# Patient Record
Sex: Female | Born: 1955 | Race: White | Hispanic: No | Smoking: Never smoker
Health system: Southern US, Community
[De-identification: ages and names within clinical notes are randomized; demographics above are authoritative.]

## PROBLEM LIST (undated history)

## (undated) DIAGNOSIS — F32A Depression, unspecified: Secondary | ICD-10-CM

## (undated) DIAGNOSIS — F329 Major depressive disorder, single episode, unspecified: Secondary | ICD-10-CM

## (undated) DIAGNOSIS — J45909 Unspecified asthma, uncomplicated: Secondary | ICD-10-CM

## (undated) HISTORY — PX: TUBAL LIGATION: SHX77

## (undated) HISTORY — PX: TONSILLECTOMY: SUR1361

## (undated) HISTORY — PX: CHOLECYSTECTOMY: SHX55

---

## 2004-08-24 ENCOUNTER — Inpatient Hospital Stay: Payer: Self-pay | Admitting: Internal Medicine

## 2004-10-24 ENCOUNTER — Ambulatory Visit: Payer: Self-pay

## 2004-11-11 ENCOUNTER — Ambulatory Visit: Payer: Self-pay

## 2005-02-23 ENCOUNTER — Ambulatory Visit: Payer: Self-pay

## 2006-08-21 ENCOUNTER — Ambulatory Visit: Payer: Self-pay | Admitting: Internal Medicine

## 2010-11-05 ENCOUNTER — Emergency Department: Payer: Self-pay | Admitting: Emergency Medicine

## 2012-11-07 ENCOUNTER — Other Ambulatory Visit: Payer: Self-pay | Admitting: Endocrinology

## 2012-11-07 DIAGNOSIS — E041 Nontoxic single thyroid nodule: Secondary | ICD-10-CM

## 2012-11-20 ENCOUNTER — Other Ambulatory Visit: Payer: Self-pay

## 2012-11-27 ENCOUNTER — Ambulatory Visit
Admission: RE | Admit: 2012-11-27 | Discharge: 2012-11-27 | Disposition: A | Discharge status: Home / Self Care | Payer: PRIVATE HEALTH INSURANCE | Source: Ambulatory Visit | Attending: Endocrinology | Admitting: Endocrinology

## 2012-11-27 ENCOUNTER — Other Ambulatory Visit (HOSPITAL_COMMUNITY)
Admission: RE | Admit: 2012-11-27 | Discharge: 2012-11-27 | Disposition: A | Discharge status: Home / Self Care | Payer: Commercial Managed Care - PPO | Source: Ambulatory Visit | Attending: Interventional Radiology | Admitting: Interventional Radiology

## 2012-11-27 DIAGNOSIS — E041 Nontoxic single thyroid nodule: Secondary | ICD-10-CM | POA: Insufficient documentation

## 2013-11-17 ENCOUNTER — Other Ambulatory Visit: Payer: Self-pay | Admitting: Endocrinology

## 2013-11-17 DIAGNOSIS — E041 Nontoxic single thyroid nodule: Secondary | ICD-10-CM

## 2013-11-20 ENCOUNTER — Other Ambulatory Visit (HOSPITAL_COMMUNITY)
Admission: RE | Admit: 2013-11-20 | Discharge: 2013-11-20 | Disposition: A | Discharge status: Home / Self Care | Payer: PRIVATE HEALTH INSURANCE | Source: Ambulatory Visit | Attending: Interventional Radiology | Admitting: Interventional Radiology

## 2013-11-20 ENCOUNTER — Ambulatory Visit
Admission: RE | Admit: 2013-11-20 | Discharge: 2013-11-20 | Disposition: A | Discharge status: Home / Self Care | Payer: PRIVATE HEALTH INSURANCE | Source: Ambulatory Visit | Attending: Endocrinology | Admitting: Endocrinology

## 2013-11-20 ENCOUNTER — Encounter (INDEPENDENT_AMBULATORY_CARE_PROVIDER_SITE_OTHER): Payer: Self-pay

## 2013-11-20 DIAGNOSIS — E041 Nontoxic single thyroid nodule: Secondary | ICD-10-CM

## 2013-12-05 ENCOUNTER — Other Ambulatory Visit (HOSPITAL_COMMUNITY): Payer: Self-pay | Admitting: Neurology

## 2013-12-05 DIAGNOSIS — R42 Dizziness and giddiness: Secondary | ICD-10-CM

## 2013-12-05 DIAGNOSIS — R519 Headache, unspecified: Secondary | ICD-10-CM

## 2013-12-05 DIAGNOSIS — R51 Headache: Secondary | ICD-10-CM

## 2013-12-10 ENCOUNTER — Other Ambulatory Visit: Payer: Commercial Managed Care - PPO

## 2013-12-17 ENCOUNTER — Other Ambulatory Visit (HOSPITAL_COMMUNITY): Payer: Self-pay | Admitting: Interventional Radiology

## 2013-12-17 ENCOUNTER — Ambulatory Visit (HOSPITAL_COMMUNITY)
Admission: RE | Admit: 2013-12-17 | Discharge: 2013-12-17 | Disposition: A | Discharge status: Home / Self Care | Payer: PRIVATE HEALTH INSURANCE | Source: Ambulatory Visit | Attending: Neurology | Admitting: Neurology

## 2013-12-17 DIAGNOSIS — R51 Headache: Principal | ICD-10-CM

## 2013-12-17 DIAGNOSIS — R519 Headache, unspecified: Secondary | ICD-10-CM

## 2013-12-17 DIAGNOSIS — R42 Dizziness and giddiness: Secondary | ICD-10-CM

## 2013-12-19 ENCOUNTER — Encounter (HOSPITAL_COMMUNITY): Payer: Self-pay | Admitting: Pharmacy Technician

## 2013-12-22 ENCOUNTER — Other Ambulatory Visit: Payer: Self-pay | Admitting: Radiology

## 2013-12-23 ENCOUNTER — Ambulatory Visit (HOSPITAL_COMMUNITY)
Admission: RE | Admit: 2013-12-23 | Discharge: 2013-12-23 | Disposition: A | Discharge status: Home / Self Care | Payer: PRIVATE HEALTH INSURANCE | Source: Ambulatory Visit | Attending: Interventional Radiology | Admitting: Interventional Radiology

## 2013-12-23 ENCOUNTER — Encounter (HOSPITAL_COMMUNITY): Payer: Self-pay

## 2013-12-23 ENCOUNTER — Other Ambulatory Visit (HOSPITAL_COMMUNITY): Payer: Self-pay | Admitting: Interventional Radiology

## 2013-12-23 DIAGNOSIS — R519 Headache, unspecified: Secondary | ICD-10-CM

## 2013-12-23 DIAGNOSIS — J45909 Unspecified asthma, uncomplicated: Secondary | ICD-10-CM | POA: Diagnosis not present

## 2013-12-23 DIAGNOSIS — G4482 Headache associated with sexual activity: Secondary | ICD-10-CM | POA: Insufficient documentation

## 2013-12-23 DIAGNOSIS — R51 Headache: Principal | ICD-10-CM

## 2013-12-23 HISTORY — DX: Unspecified asthma, uncomplicated: J45.909

## 2013-12-23 HISTORY — DX: Major depressive disorder, single episode, unspecified: F32.9

## 2013-12-23 HISTORY — DX: Depression, unspecified: F32.A

## 2013-12-23 LAB — BASIC METABOLIC PANEL
ANION GAP: 14 (ref 5–15)
BUN: 15 mg/dL (ref 6–23)
CALCIUM: 9.1 mg/dL (ref 8.4–10.5)
CHLORIDE: 103 meq/L (ref 96–112)
CO2: 23 meq/L (ref 19–32)
Creatinine, Ser: 0.64 mg/dL (ref 0.50–1.10)
GFR calc Af Amer: >90 mL/min (ref >90)
GFR calc non Af Amer: >90 mL/min (ref >90)
Glucose, Bld: 162 mg/dL — ABNORMAL HIGH (ref 70–99)
POTASSIUM: 3.9 meq/L (ref 3.7–5.3)
SODIUM: 140 meq/L (ref 137–147)

## 2013-12-23 LAB — CBC WITH DIFFERENTIAL/PLATELET
BASOS ABS: 0 10*3/uL (ref 0.0–0.1)
Basophils Relative: 0 % (ref 0–1)
Eosinophils Absolute: 0 10*3/uL (ref 0.0–0.7)
Eosinophils Relative: 0 % (ref 0–5)
HEMATOCRIT: 37.4 % (ref 36.0–46.0)
Hemoglobin: 12.5 g/dL (ref 12.0–15.0)
LYMPHS ABS: 0.8 10*3/uL (ref 0.7–4.0)
LYMPHS PCT: 9 % — AB (ref 12–46)
MCH: 30.3 pg (ref 26.0–34.0)
MCHC: 33.4 g/dL (ref 30.0–36.0)
MCV: 90.8 fL (ref 78.0–100.0)
MONO ABS: 0.1 10*3/uL (ref 0.1–1.0)
Monocytes Relative: 1 % — ABNORMAL LOW (ref 3–12)
NEUTROS ABS: 7.9 10*3/uL — AB (ref 1.7–7.7)
Neutrophils Relative %: 90 % — ABNORMAL HIGH (ref 43–77)
Platelets: 227 10*3/uL (ref 150–400)
RBC: 4.12 MIL/uL (ref 3.87–5.11)
RDW: 13.1 % (ref 11.5–15.5)
WBC: 8.7 10*3/uL (ref 4.0–10.5)

## 2013-12-23 LAB — GLUCOSE, CAPILLARY: GLUCOSE-CAPILLARY: 153 mg/dL — AB (ref 70–99)

## 2013-12-23 LAB — PROTIME-INR
INR: 1 (ref 0.00–1.49)
PROTHROMBIN TIME: 13.2 s (ref 11.6–15.2)

## 2013-12-23 LAB — APTT: aPTT: 25 seconds (ref 24–37)

## 2013-12-23 MED ORDER — FENTANYL CITRATE 0.05 MG/ML IJ SOLN
INTRAMUSCULAR | Status: AC | PRN
  Administered 2013-12-23: 25 ug via INTRAVENOUS

## 2013-12-23 MED ORDER — HEPARIN SODIUM (PORCINE) 1000 UNIT/ML IJ SOLN
INTRAMUSCULAR | Status: AC | PRN
  Administered 2013-12-23: 1000 [IU] via INTRAVENOUS

## 2013-12-23 MED ORDER — LIDOCAINE HCL 1 % IJ SOLN
INTRAMUSCULAR | Status: AC
  Filled 2013-12-23: qty 20

## 2013-12-23 MED ORDER — MIDAZOLAM HCL 2 MG/2ML IJ SOLN
INTRAMUSCULAR | Status: AC
  Filled 2013-12-23: qty 2

## 2013-12-23 MED ORDER — SODIUM CHLORIDE 0.9 % IV SOLN
Freq: Once | INTRAVENOUS | Status: AC
  Administered 2013-12-23: 09:00:00 via INTRAVENOUS

## 2013-12-23 MED ORDER — HEPARIN SODIUM (PORCINE) 1000 UNIT/ML IJ SOLN
INTRAMUSCULAR | Status: AC
  Filled 2013-12-23: qty 1

## 2013-12-23 MED ORDER — FENTANYL CITRATE 0.05 MG/ML IJ SOLN
INTRAMUSCULAR | Status: AC
  Filled 2013-12-23: qty 2

## 2013-12-23 MED ORDER — SODIUM CHLORIDE 0.9 % IV SOLN: INTRAVENOUS | Status: AC

## 2013-12-23 MED ORDER — MIDAZOLAM HCL 2 MG/2ML IJ SOLN
INTRAMUSCULAR | Status: AC | PRN
  Administered 2013-12-23: 1 mg via INTRAVENOUS

## 2013-12-23 MED ORDER — IOHEXOL 300 MG/ML  SOLN
150.0000 mL | Freq: Once | INTRAMUSCULAR | Status: AC | PRN
  Administered 2013-12-23: 75 mL via INTRAVENOUS

## 2013-12-23 NOTE — Discharge Instructions (Signed)

## 2013-12-23 NOTE — Sedation Documentation (Signed)
Patient denies pain and is resting comfortably.  

## 2013-12-23 NOTE — Procedures (Signed)
S/P 4 vessel cerebral arteriogram,rt CFA approach. Findings. 1.No aneurysm,stenosis,dissections or  Intraluminal filling defects or AV shunting seen . 2.Venous outflow WNLs

## 2013-12-23 NOTE — Sedation Documentation (Signed)
Patient is resting comfortably. 

## 2013-12-23 NOTE — Sedation Documentation (Signed)
Vital signs stable. 

## 2013-12-23 NOTE — H&P (Signed)
Chief Complaint: Headache associated with orgasm  Referring Physician(s): Deveshwar,Sanjeev K  History of Present Illness: Joyce Maxwell is a 58 y.o. female  Pt has had 2 episodes of extreme headache followed by 12-16 hrs of nausea/vomiting post intercourse. MRI/MRA performed in outside facility was reviewed with Dr Corliss Skainseveshwar at consult Noted abnormal appearing L Internal carotid artery Now scheduled for cerebral arteriogram Denied any other symptoms  Past Medical History  Diagnosis Date  . Asthma   . Depression     Past Surgical History  Procedure Laterality Date  . Tonsillectomy    . Cholecystectomy    . Tubal ligation      Allergies: Aspirin; Ibuprofen; Iodinated diagnostic agents; Shellfish-derived products; Penicillins; and Tetracyclines & related  Medications: Prior to Admission medications   Medication Sig Start Date End Date Taking? Authorizing Provider  albuterol (PROVENTIL HFA;VENTOLIN HFA) 108 (90 BASE) MCG/ACT inhaler Inhale 1-2 puffs into the lungs every 6 (six) hours as needed for wheezing or shortness of breath.   Yes Historical Provider, MD  cyanocobalamin (,VITAMIN B-12,) 1000 MCG/ML injection Inject 1,000 mcg into the muscle every 30 (thirty) days.   Yes Historical Provider, MD  escitalopram (LEXAPRO) 10 MG tablet Take 20 mg by mouth daily.   Yes Historical Provider, MD  fenofibrate 160 MG tablet Take 160 mg by mouth daily.   Yes Historical Provider, MD  fluticasone (FLONASE) 50 MCG/ACT nasal spray Place 1-2 sprays into both nostrils daily.   Yes Historical Provider, MD  levalbuterol (XOPENEX) 1.25 MG/3ML nebulizer solution Take 1.25 mg by nebulization every 4 (four) hours as needed for wheezing.   Yes Historical Provider, MD  metFORMIN (GLUCOPHAGE) 1000 MG tablet Take 500 mg by mouth 2 (two) times daily.   Yes Historical Provider, MD  montelukast (SINGULAIR) 10 MG tablet Take 10 mg by mouth daily.   Yes Historical Provider, MD  Vitamin D,  Ergocalciferol, (DRISDOL) 50000 UNITS CAPS capsule Take 50,000 Units by mouth 3 (three) times a week.   Yes Historical Provider, MD    History reviewed. No pertinent family history.  History   Social History  . Marital Status: Divorced    Spouse Name: N/A    Number of Children: N/A  . Years of Education: N/A   Social History Main Topics  . Smoking status: Never Smoker   . Smokeless tobacco: None  . Alcohol Use: None  . Drug Use: None  . Sexual Activity: None   Other Topics Concern  . None   Social History Narrative  . None    Review of Systems: A 12 point ROS discussed and pertinent positives are indicated in the HPI above.  All other systems are negative.  Review of Systems  Constitutional: Negative for appetite change, fatigue and unexpected weight change.  Respiratory: Negative for cough, chest tightness and shortness of breath.   Cardiovascular: Negative for chest pain.  Gastrointestinal: Positive for nausea and vomiting. Negative for abdominal pain.  Genitourinary: Negative for dysuria and frequency.  Musculoskeletal: Negative for back pain and neck pain.  Neurological: Positive for headaches. Negative for dizziness, seizures, syncope, speech difficulty, weakness, light-headedness and numbness.  Psychiatric/Behavioral: Negative for behavioral problems and confusion.    Vital Signs: BP 138/72  Pulse 88  Temp(Src) 97.5 F (36.4 C) (Oral)  Resp 18  Ht 5\' 4"  (1.626 m)  Wt 73.936 kg (163 lb)  BMI 27.97 kg/m2  SpO2 98%  Physical Exam  Constitutional: She is oriented to person, place, and time. She appears well-nourished.  Cardiovascular: Normal rate, regular rhythm and normal heart sounds.   No murmur heard. Pulmonary/Chest: Effort normal and breath sounds normal. She has no wheezes.  Abdominal: Soft. Bowel sounds are normal. There is no tenderness.  Musculoskeletal: Normal range of motion.  Neurological: She is alert and oriented to person, place, and time.    Skin: Skin is warm and dry.  Psychiatric: She has a normal mood and affect. Her behavior is normal. Judgment and thought content normal.    Imaging: [facility] Radiologist Eval & Mgmt  12/18/2013   EXAM: NEW PATIENT OFFICE VISIT  CHIEF COMPLAINT: Headaches associated with orgasm. Abnormal MRA examination of the brain.  Current Pain Level: 1-10  HISTORY OF PRESENT ILLNESS: The patient is a 58 year old right-handed lady who is referred for evaluation of findings on MRI MRA examination. Patient is accompanied by her spouse.  Briefly the patient reports new onset of orgasmic headaches severe lasting up to 1-2 days with vomiting. The first episode was on 11/20/2013. This lasted about 16 hours. She had yet another episode on 11/23/2013 which lasted 45 min. Each of these was associated with severe nausea.  However, she denies any other symptomatology such as passing out, seizures, visual aberrations, motor symptoms or of double vision.  The first time the patient had to go to her primary care physician and be treated with intramuscular analgesics.  The patient does carry a past history of migraine headaches in her younger days. These respond to Well Patch and are usually overlying the vertex.  These new headaches are totally different with much severe intensity.  The patient also reports episodic dizziness following her first thyroid biopsy.  These usually lasted for about 10-15 seconds associated with an impending sensation of generalized weakness. No other associated symptoms.  The patient also reports having pain in the left side of her neck which started a few weeks ago. These apparently were not related to any trauma.  Past Medical History: Asthma. Depression. Thyroid nodule biopsied recently negative.  Previous Surgeries: Tonsillectomy in 1970. Tubal ligation in 1984. Gallbladder surgery in 1990.  Medications: Metformin.  Lexapro.  Fenofibrate.  Singulair.  Allergies: Aspirin. Ibuprofen. Shellfish. CT iodine contrast.  All these cause anaphylaxis. Penicillin and tetracycline cause a rash.  Social History: Married. Works as a Games developer. Drinks alcohol occasionally. Denies any smoking at this time. Stopped in 1990. Denies using any illicit chemicals.  Family History: Positive for diabetes. Heart problems. High blood pressure. Migraine headaches. Stroke.  REVIEW OF SYSTEMS: Essentially negative for pathologic symptomatology.  PHYSICAL EXAMINATION: Normal affect.  Responses appropriate.  In no acute distress.  Neurologically awake, alert, oriented to time, place, space. Speech and comprehension normal. No lateralizing cranial nerve abnormalities or motor or sensory symptoms.  ASSESSMENT AND PLAN: The patient's recent MRI of the brain and MRA of the brain from an outside institution were reviewed. The MRI of the brain did not reveal evidence of acute ischemic changes on the diffusion-weighted images. The FLAIR and T2 sequences were also unremarkable.  On the MRA examination areas of potential abnormality were those of the left internal carotid artery, the distal cervical segment with focal dilatation and a linear hypo intensity.  Both posterior cerebral arteries demonstrated areas of decreased signal intensity in the right P1 segment and the left P2 P3 regions.  Question of an infundibulum versus a saccular thrombus at the origin of the right anterior-inferior cerebellar artery.  Given the patient's clinical history and above MRI findings, it is felt the patient  would need a formal catheter angiogram to accurately evaluate the above suspicious MRA finding. The procedure of diagnostic catheter arteriogram, the reasons, the risks and alternatives were all reviewed in detail.  In the meantime the patient will be given a prescription for prednisone prep to be taken prior to her catheter angiogram given her history of anaphylactic reaction to iodine contrast dye.  All questions were answered to their satisfaction. Both the patient  and her spouse leave with a good understanding and agreement with the above management plan. Catheter angiogram will be scheduled at the earliest possible.   Electronically Signed   By: Julieanne Cotton M.D.   On: 12/17/2013 15:53    Labs: Lab Results  Component Value Date   WBC 8.7 12/23/2013    Assessment and Plan:  Headache associated with orgasm Abnormal MRA: L ICA findings Pt now scheduled for cerebral arteriogram Pt aware of procedure benefits and risks and agreeable to proceed Consent signed andin chart  Thank you for this interesting consult.  I greatly enjoyed meeting Joyce Maxwell and look forward to participating in their care.   I spent a total of 20 minutes face to face in clinical consultation, greater than 50% of which was counseling/coordinating care for cerebral arteriogram  Signed: Donelle Baba A 12/23/2013, 9:15 AM

## 2014-03-06 ENCOUNTER — Other Ambulatory Visit: Payer: Self-pay

## 2014-03-06 DIAGNOSIS — Z1231 Encounter for screening mammogram for malignant neoplasm of breast: Secondary | ICD-10-CM

## 2014-03-26 ENCOUNTER — Ambulatory Visit
Admission: RE | Admit: 2014-03-26 | Discharge: 2014-03-26 | Disposition: A | Discharge status: Home / Self Care | Payer: PRIVATE HEALTH INSURANCE | Source: Ambulatory Visit

## 2014-03-26 DIAGNOSIS — Z1231 Encounter for screening mammogram for malignant neoplasm of breast: Secondary | ICD-10-CM

## 2014-03-31 ENCOUNTER — Other Ambulatory Visit: Payer: Self-pay | Admitting: Specialist

## 2014-03-31 DIAGNOSIS — R928 Other abnormal and inconclusive findings on diagnostic imaging of breast: Secondary | ICD-10-CM

## 2014-04-03 ENCOUNTER — Ambulatory Visit
Admission: RE | Admit: 2014-04-03 | Discharge: 2014-04-03 | Disposition: A | Discharge status: Home / Self Care | Payer: PRIVATE HEALTH INSURANCE | Source: Ambulatory Visit | Attending: Specialist | Admitting: Specialist

## 2014-04-03 DIAGNOSIS — R928 Other abnormal and inconclusive findings on diagnostic imaging of breast: Secondary | ICD-10-CM

## 2016-06-27 IMAGING — US US THYROID BIOPSY
1 series · 8 of 8 positions shown · non-contrast
Comparison: 11/12/2013

CLINICAL DATA: Dominant right inferior thyroid nodule

EXAM:
ULTRASOUND GUIDED NEEDLE ASPIRATE BIOPSY OF THE THYROID GLAND

[Series 1: us thyroid biopsy · 8 acquisitions, 8 frames shown]
[im 1/8]
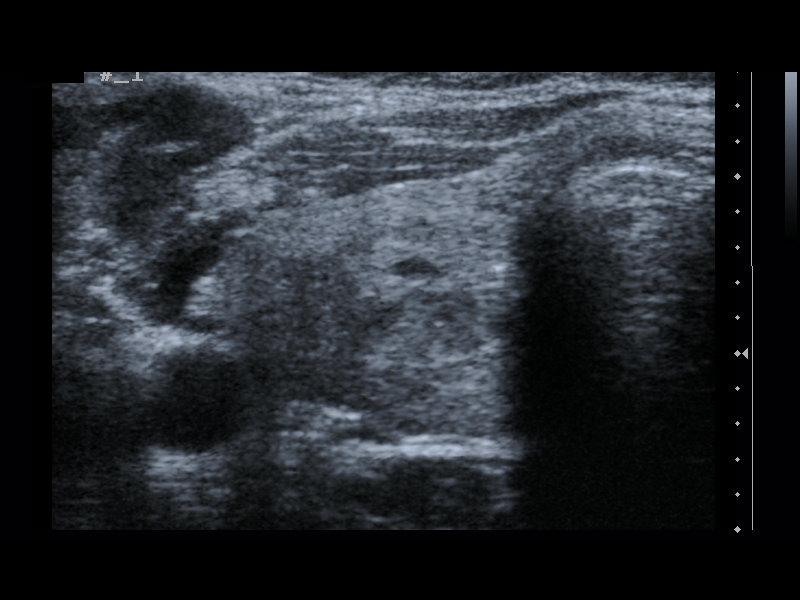
[im 2/8]
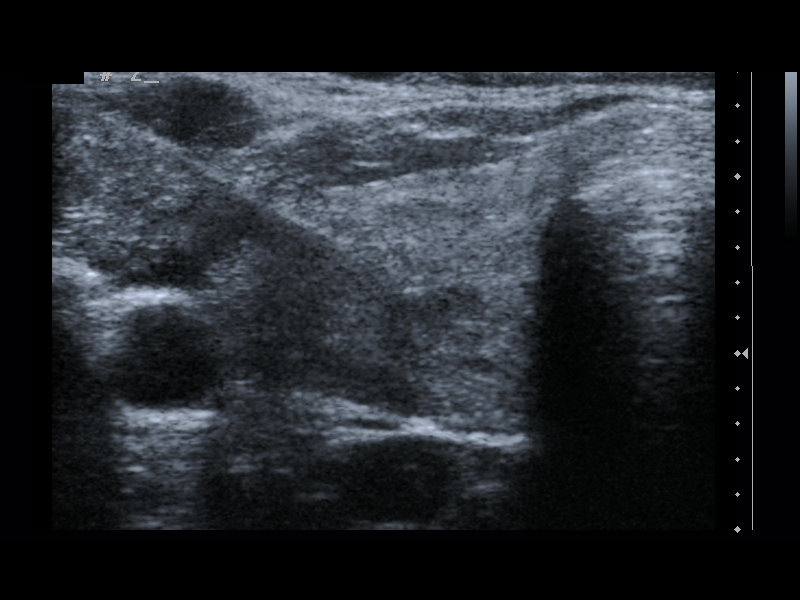
[im 3/8]
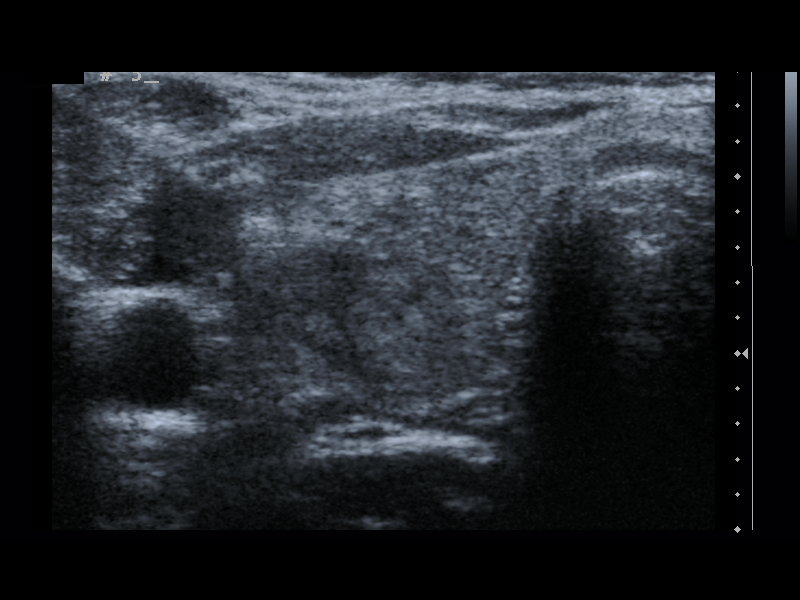
[im 4/8]
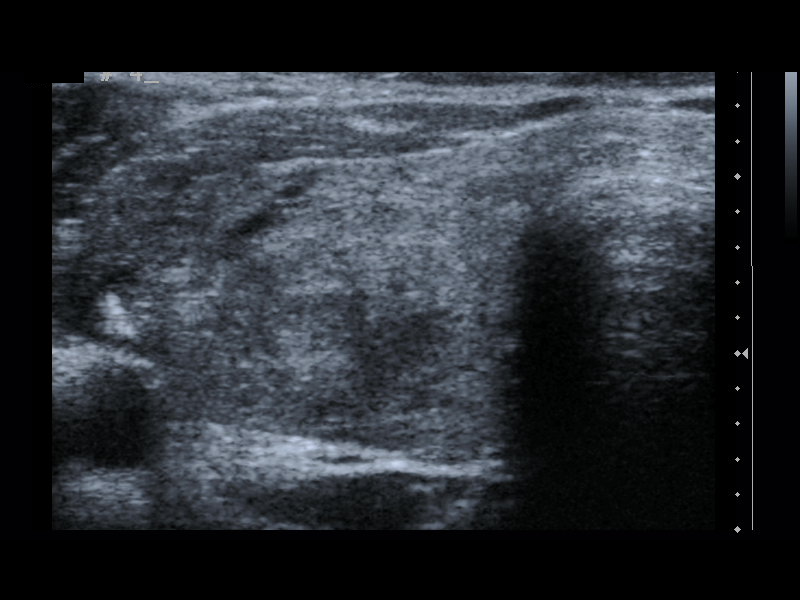
[im 5/8]
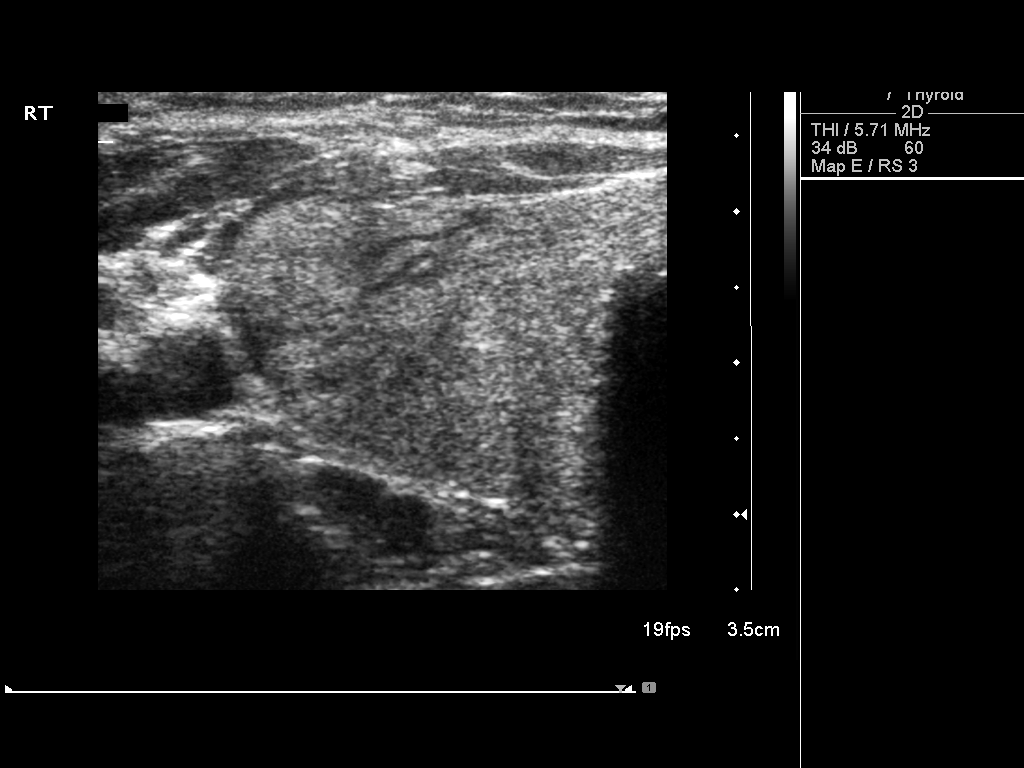
[im 6/8]
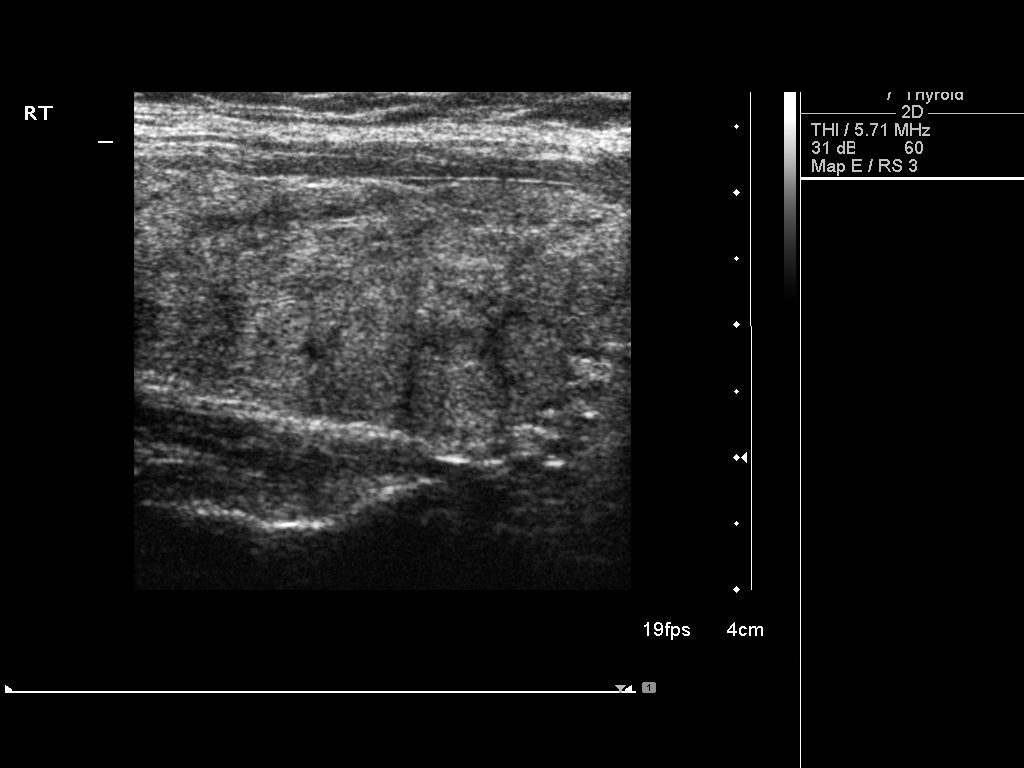
[im 7/8]
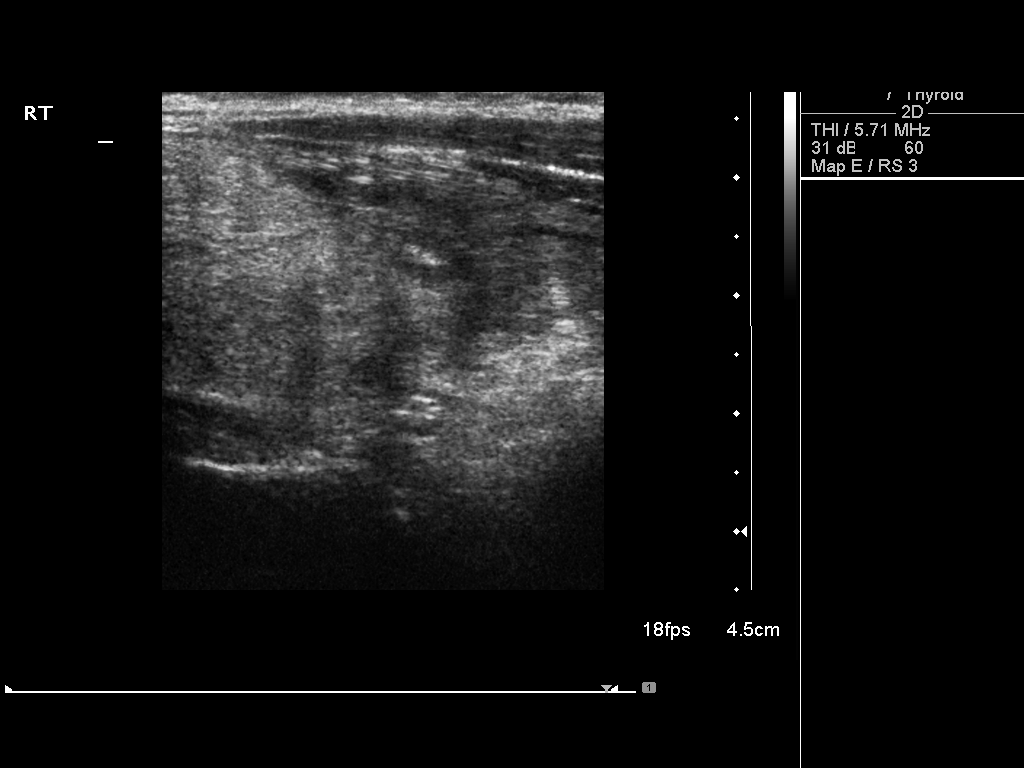
[im 8/8]
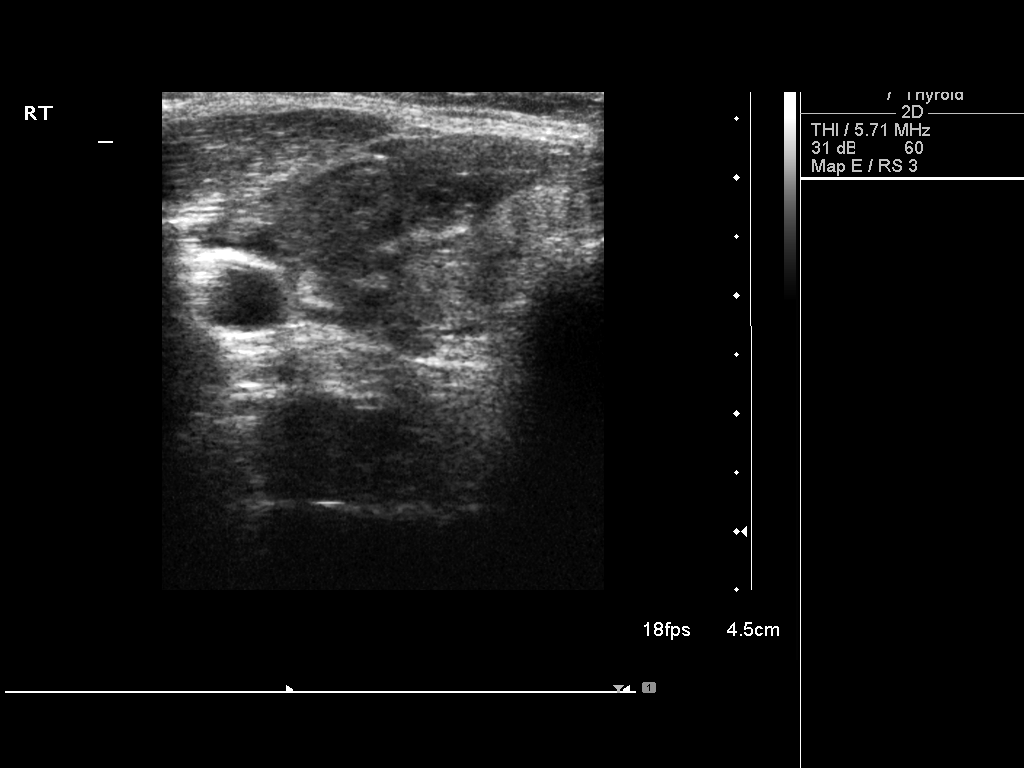

[8 of 8 positions shown; findings below may reference images not displayed]

PROCEDURE:
Thyroid biopsy was thoroughly discussed with the patient and
questions were answered. The benefits, risks, alternatives, and
complications were also discussed. The patient understands and
wishes to proceed with the procedure. Written consent was obtained.

Ultrasound was performed to localize and mark an adequate site for
the biopsy. The patient was then prepped and draped in a normal
sterile fashion. Local anesthesia was provided with 1% lidocaine.
Using direct ultrasound guidance, 4 passes were made using needles
into the nodule within the right lobe of the thyroid. Ultrasound was
used to confirm needle placements on all occasions. Specimens were
sent to Pathology for analysis.

Complications:  No immediate
FINDINGS: Imaging confirms needle placement in the dominant right inferior
thyroid nodule
IMPRESSION: Ultrasound guided needle aspirate biopsy performed of the dominant
right inferior thyroid nodule.

## 2016-10-31 IMAGING — MG MM SCREENING BREAST TOMO BILATERAL
8 series · 9 of 24 positions shown · non-contrast
Comparison: Previous exam(s)

ACR Breast Density Category c. Heterogeneous fibroglandular tissue.

CLINICAL DATA: Screening.

EXAM:
DIGITAL SCREENING BILATERAL MAMMOGRAM WITH 3D TOMO WITH CAD

[R CC]
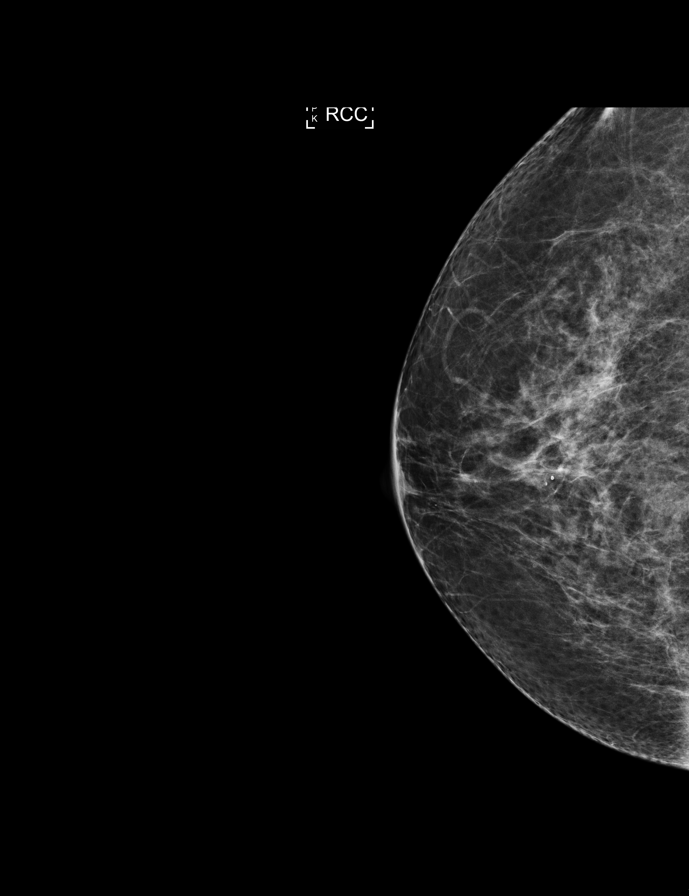

[R MLO]
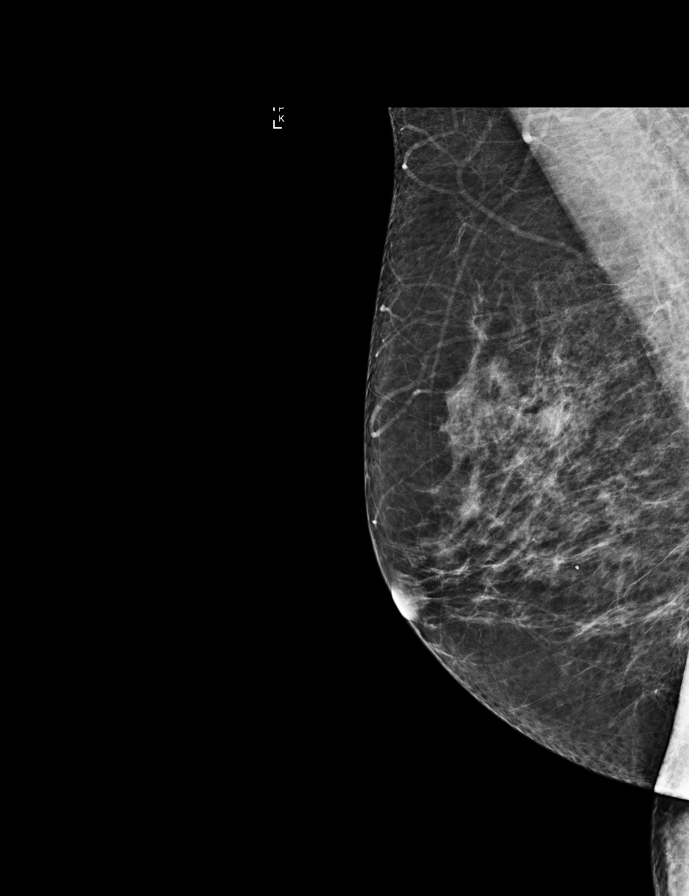

[L CC]
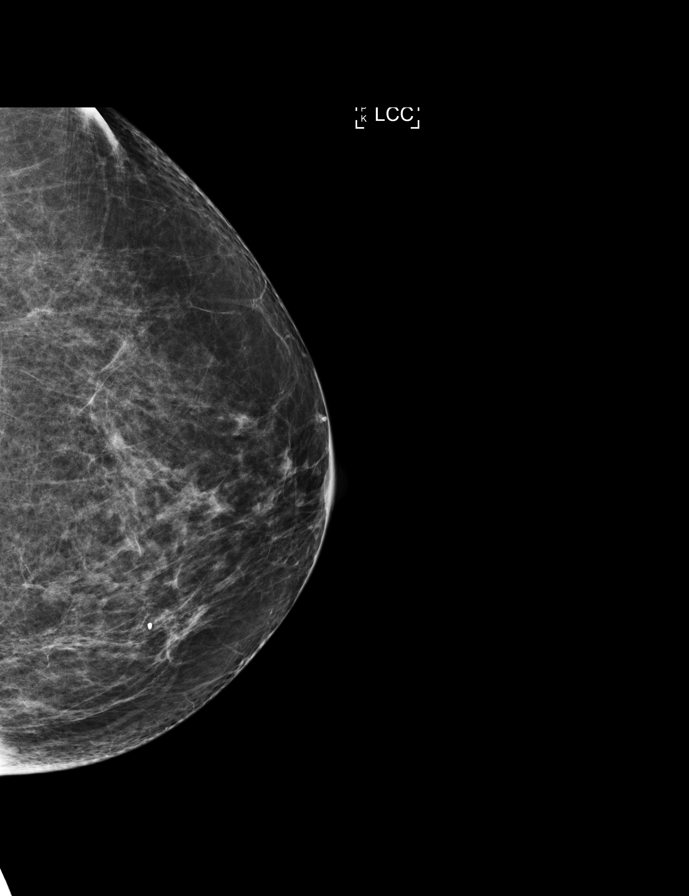

[L MLO]
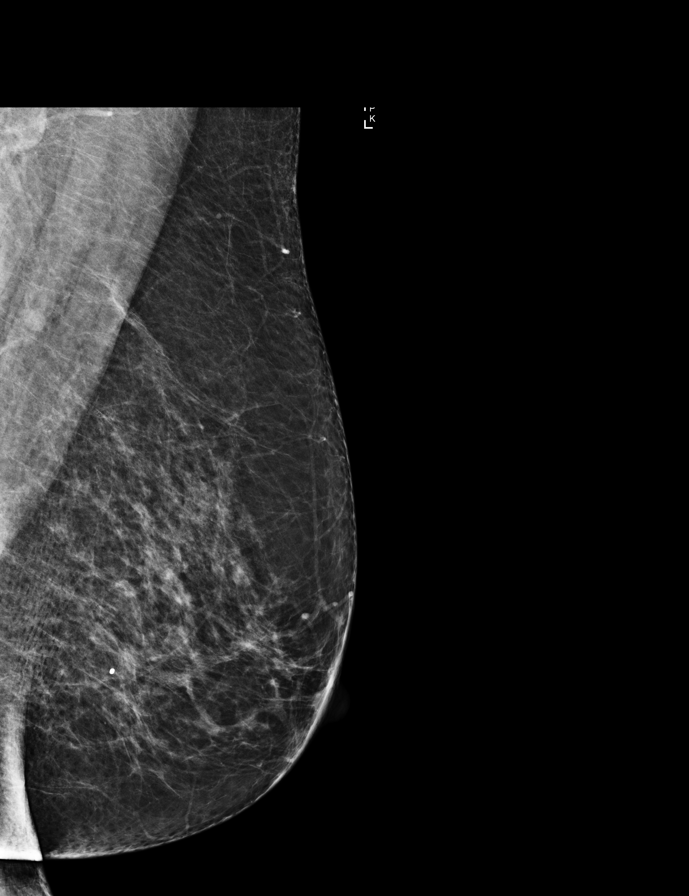

[L CC tomo · 2 of 72 frames shown]
[frame 24/72]
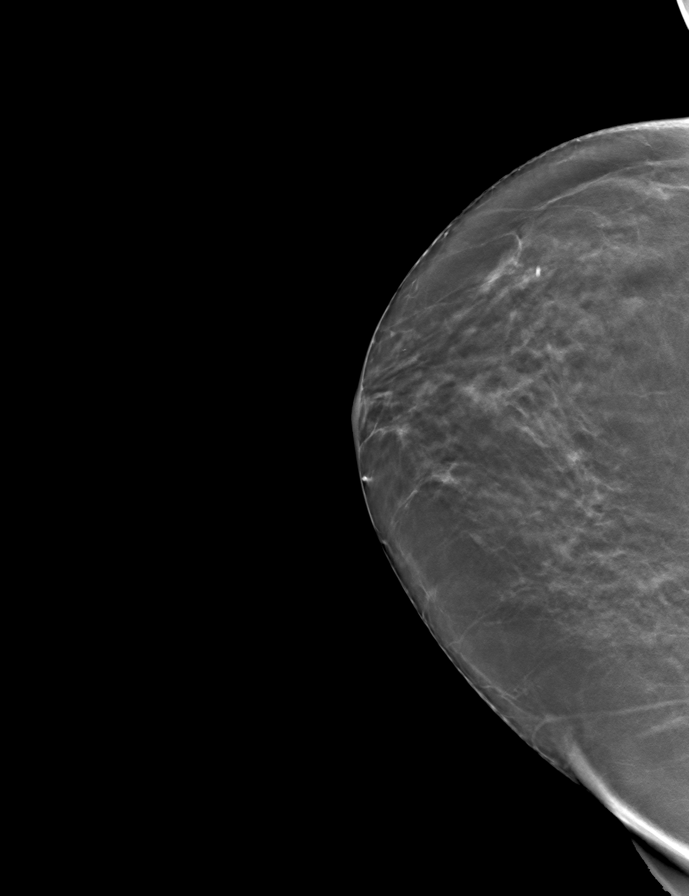
[frame 37/72]
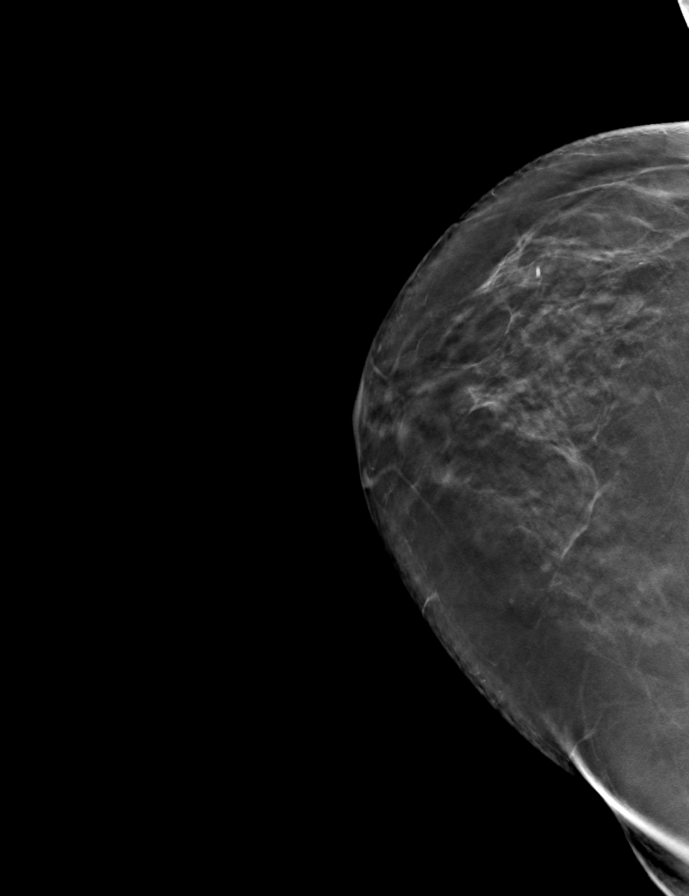

[R CC tomo · tomo slice 33/66.0]
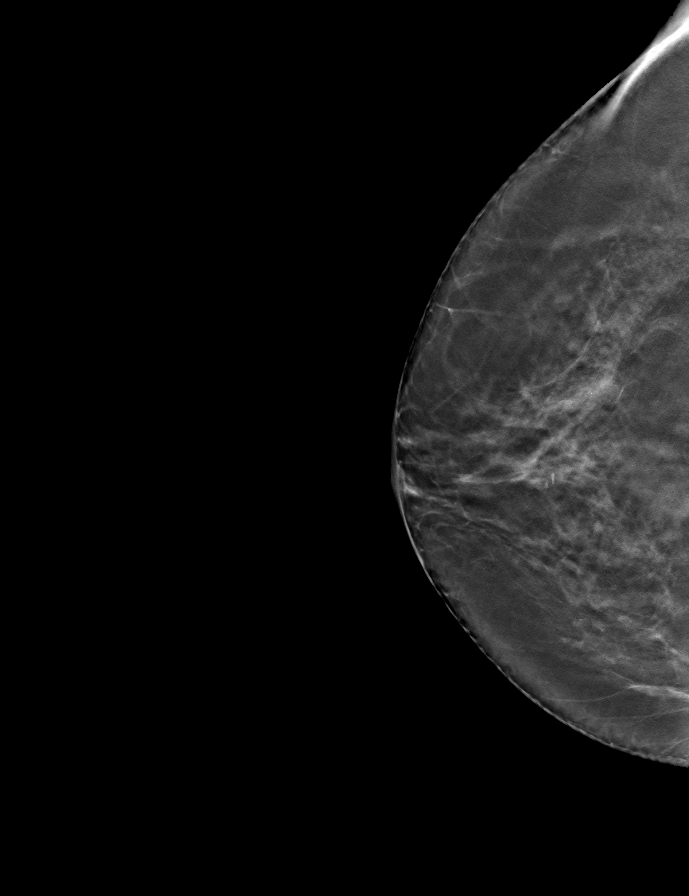

[R MLO tomo · tomo slice 33/64.0]
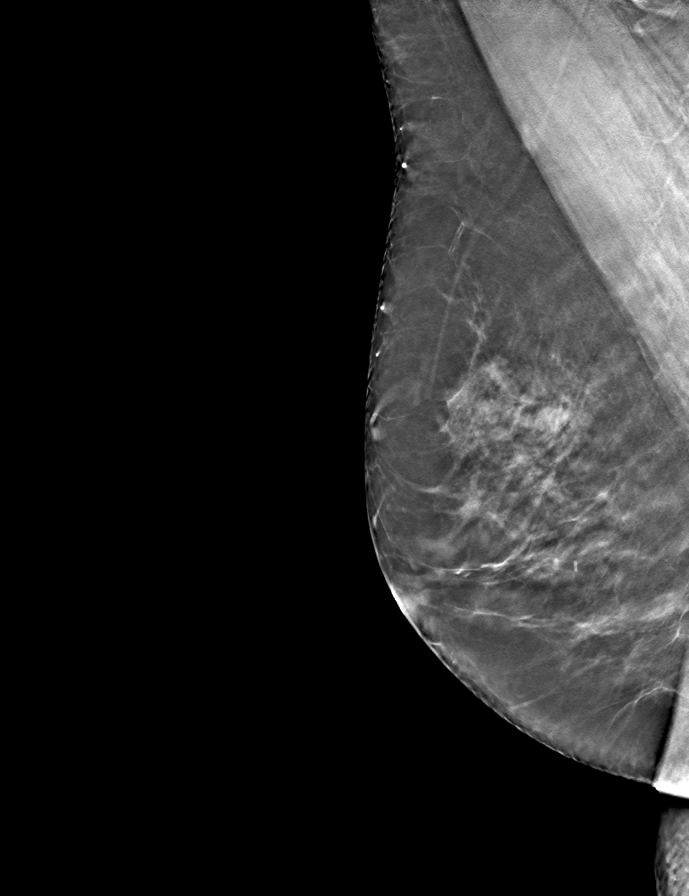

[L MLO tomo · tomo slice 37/74.0]
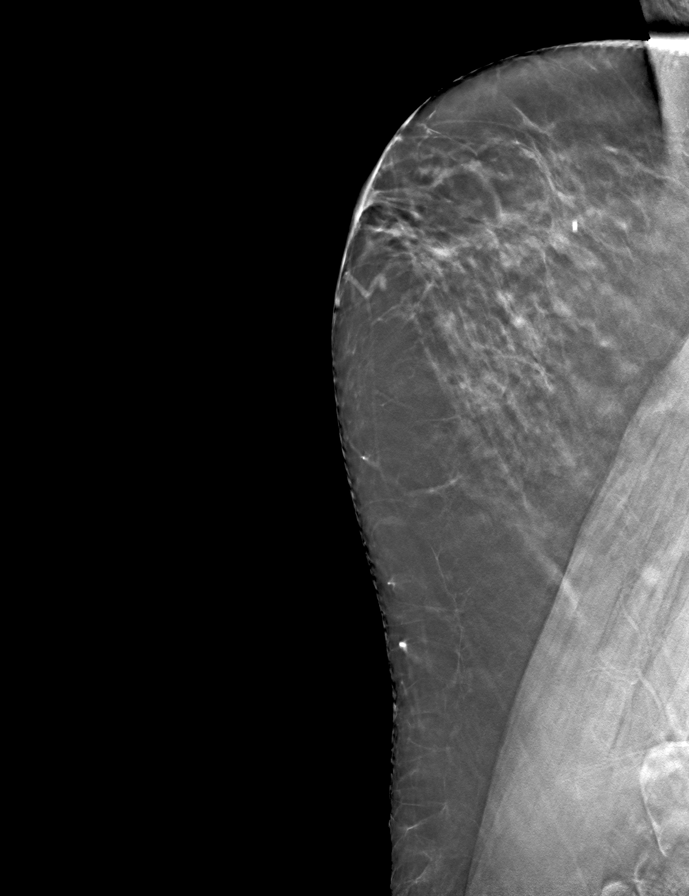

[9 of 24 positions shown; findings below may reference images not displayed]

FINDINGS: No findings suspicious for malignancy.The patient complains of focal
right axillary pain. Images were processed with CAD.
IMPRESSION: No mammographic evidence of malignancy. However, follow-up is
suggested for evaluation of the patient's complaint of focal right
axillary pain.

RECOMMENDATION:
Right breast ultrasound. The patient will be contacted regarding
additional right breast imaging.

BI-RADS CATEGORY  0: Incomplete. Need additional imaging evaluation
and/or prior mammograms for comparison

## 2016-11-08 IMAGING — US US BREAST LTD UNI RIGHT INC AXILLA
1 series · 9 of 9 positions shown · non-contrast
Comparison: Correlation with mammogram from 03/26/2014

CLINICAL DATA: Focal right breast pain

EXAM:
ULTRASOUND OF THE RIGHT BREAST

[Series 1: us breast ltd uni right inc axilla · 9 of 9 slices shown]
[im 1/9]
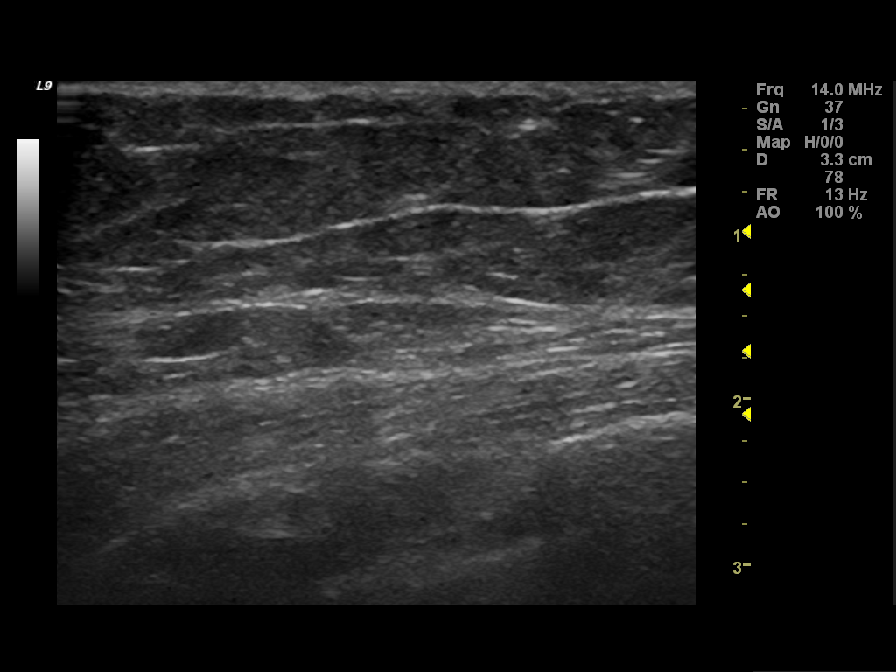
[im 2/9]
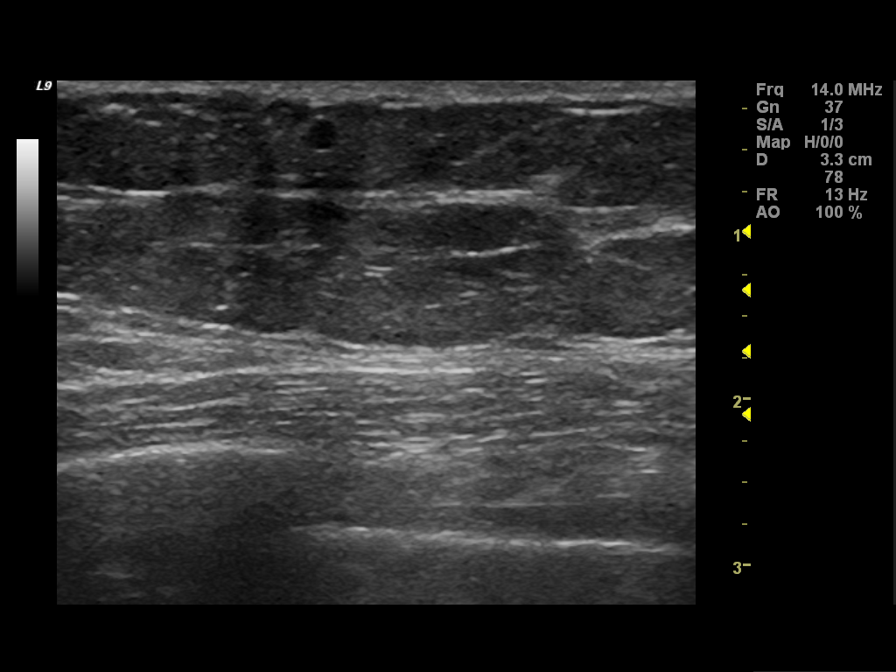
[im 3/9]
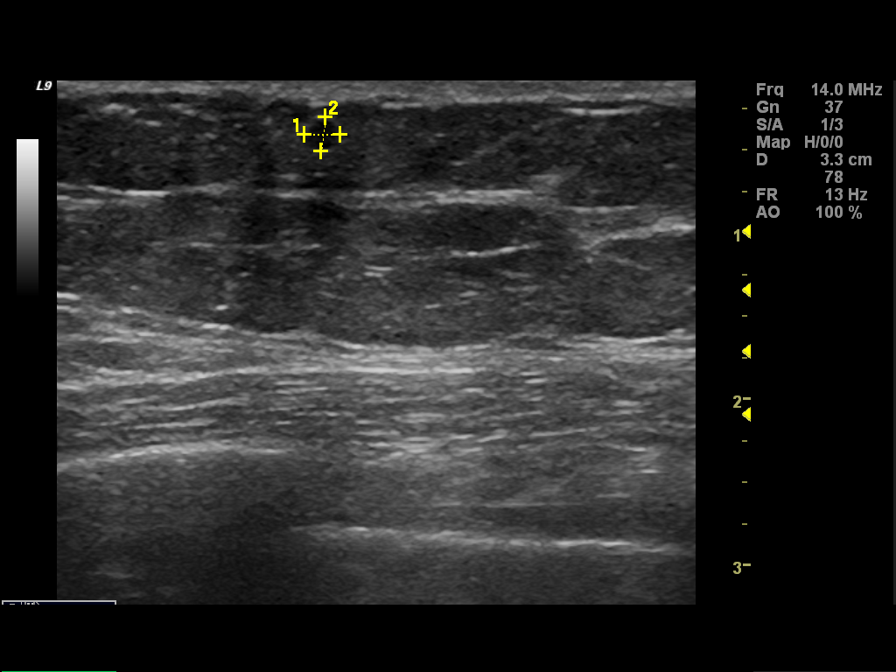
[im 4/9]
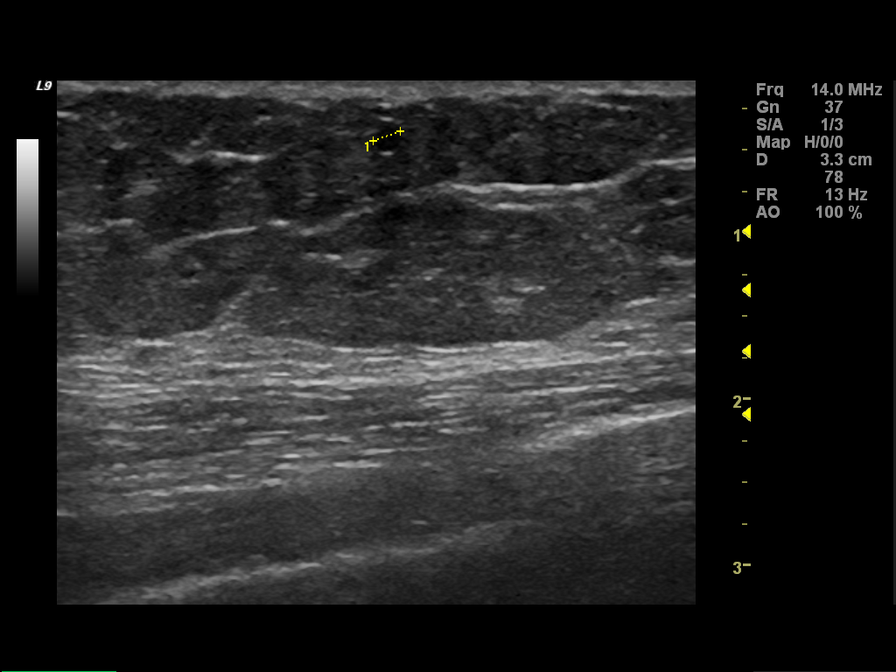
[im 5/9]
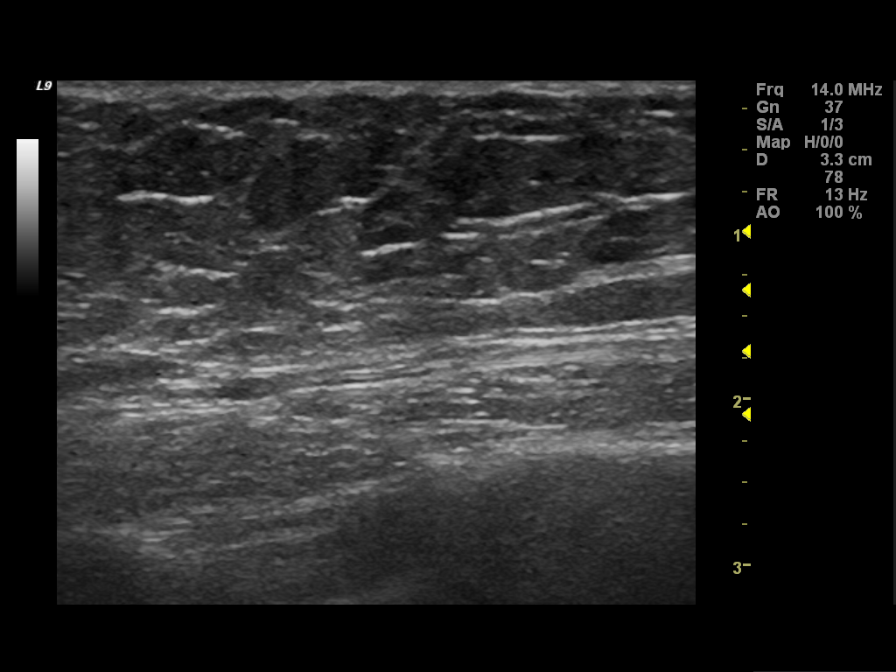
[im 6/9]
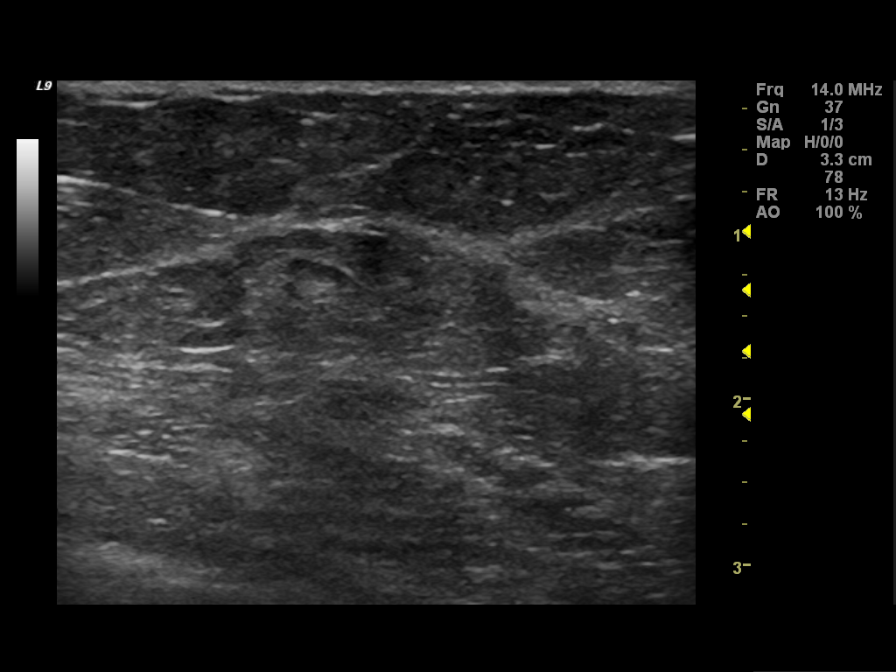
[im 7/9]
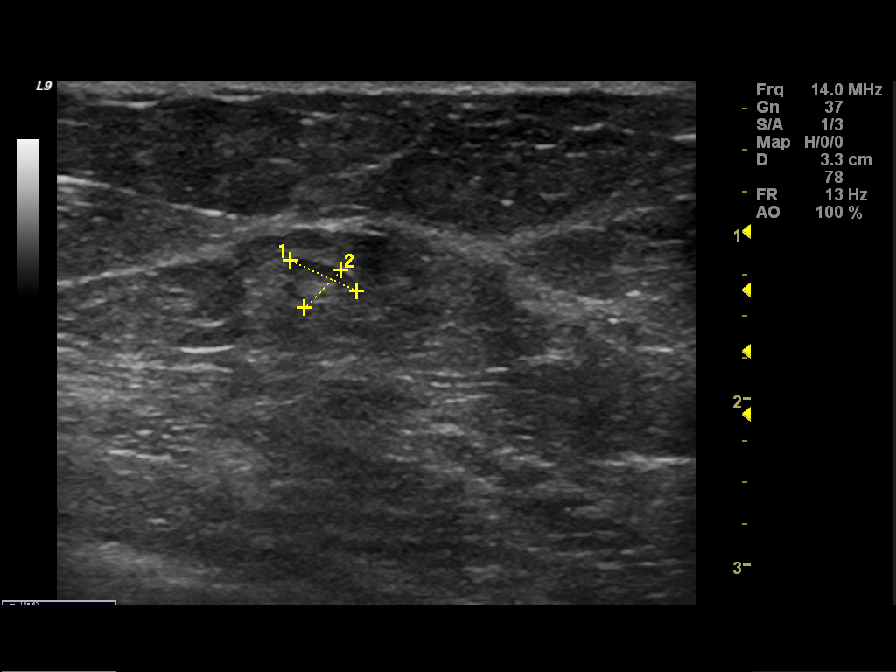
[im 8/9]
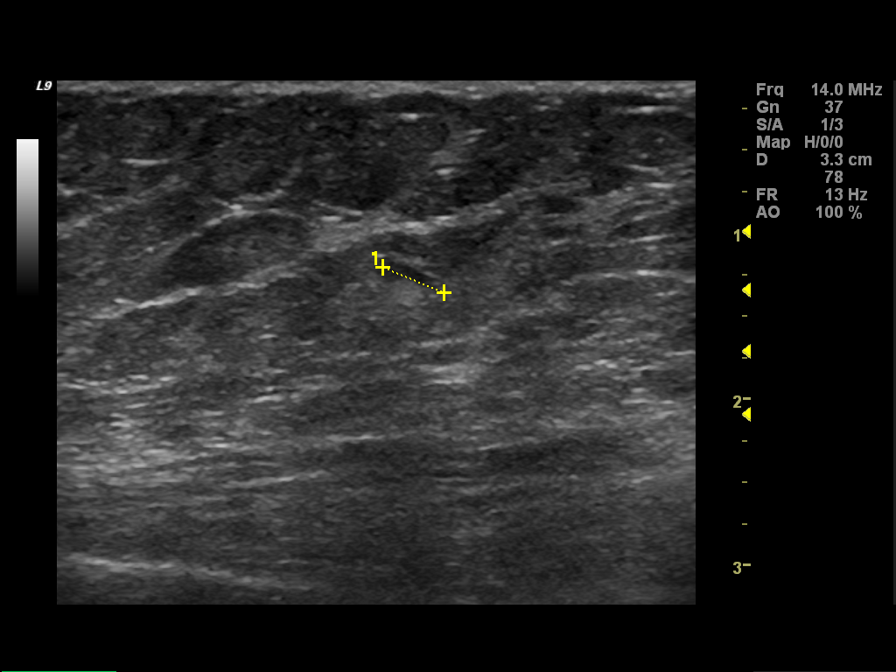
[im 9/9]
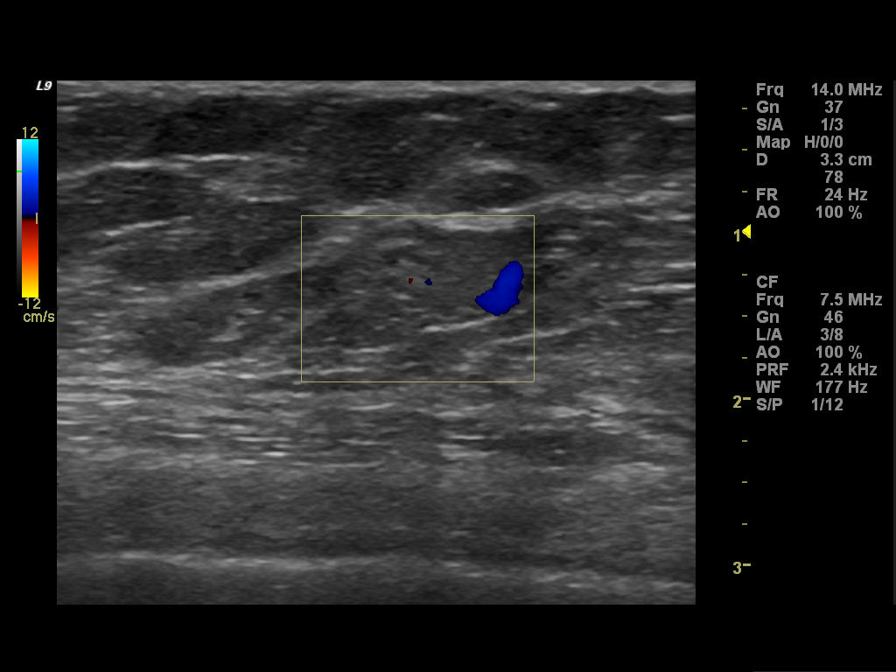

[9 of 9 positions shown; findings below may reference images not displayed]

FINDINGS: On physical exam, focal areas of pain are noted in the lateral right
breast, approximately 10-12 cm from the nipple.

Ultrasound is performed, showing 0.2 x 0.2 x 0.2 cm cyst in the 8
o'clock position of the right breast, 10 cm from the nipple, this
corresponds to a focal site of pain. In the 9 o'clock position of
the right breast, 10 cm from nipple a normal intramammary lymph node
is present and also corresponds to a focal site of pain. No
suspicious mass, shadowing or fluid collection is identified in the
area of pain.
IMPRESSION: Focal areas of pain within the lateral right breast corresponds to a
simple cyst and a normal intramammary lymph node. There is no
sonographic evidence of malignancy or other suspicious abnormality.

RECOMMENDATION:
Recommend annual screening mammograms. The patient should return
sooner if clinically indicated.

I have discussed the findings and recommendations with the patient.
Results were also provided in writing at the conclusion of the
visit. If applicable, a reminder letter will be sent to the patient
regarding the next appointment.

BI-RADS CATEGORY  2: Benign Finding(s)
